# Patient Record
Sex: Female | Born: 1975 | Hispanic: No | Marital: Married | Smoking: Never smoker
Health system: Southern US, Community
[De-identification: ages and names within clinical notes are randomized; demographics above are authoritative.]

## PROBLEM LIST (undated history)

## (undated) DIAGNOSIS — E039 Hypothyroidism, unspecified: Secondary | ICD-10-CM

## (undated) DIAGNOSIS — R87629 Unspecified abnormal cytological findings in specimens from vagina: Secondary | ICD-10-CM

## (undated) DIAGNOSIS — F329 Major depressive disorder, single episode, unspecified: Secondary | ICD-10-CM

## (undated) DIAGNOSIS — F32A Depression, unspecified: Secondary | ICD-10-CM

## (undated) HISTORY — PX: WISDOM TOOTH EXTRACTION: SHX21

## (undated) HISTORY — DX: Hypothyroidism, unspecified: E03.9

## (undated) HISTORY — PX: COLPOSCOPY: SHX161

## (undated) HISTORY — DX: Depression, unspecified: F32.A

## (undated) HISTORY — DX: Major depressive disorder, single episode, unspecified: F32.9

## (undated) HISTORY — DX: Unspecified abnormal cytological findings in specimens from vagina: R87.629

---

## 2004-08-15 ENCOUNTER — Other Ambulatory Visit: Admission: RE | Admit: 2004-08-15 | Discharge: 2004-08-15 | Payer: Self-pay | Admitting: Family Medicine

## 2005-10-27 ENCOUNTER — Other Ambulatory Visit: Admission: RE | Admit: 2005-10-27 | Discharge: 2005-10-27 | Payer: Self-pay | Admitting: Family Medicine

## 2006-11-19 ENCOUNTER — Other Ambulatory Visit: Admission: RE | Admit: 2006-11-19 | Discharge: 2006-11-19 | Payer: Self-pay | Admitting: Family Medicine

## 2007-11-22 ENCOUNTER — Other Ambulatory Visit: Admission: RE | Admit: 2007-11-22 | Discharge: 2007-11-22 | Payer: Self-pay | Admitting: Family Medicine

## 2010-04-01 ENCOUNTER — Other Ambulatory Visit: Admission: RE | Admit: 2010-04-01 | Discharge: 2010-04-01 | Payer: Self-pay | Admitting: Family Medicine

## 2011-04-23 ENCOUNTER — Other Ambulatory Visit: Payer: Self-pay | Admitting: Family Medicine

## 2011-04-23 ENCOUNTER — Other Ambulatory Visit (HOSPITAL_COMMUNITY)
Admission: RE | Admit: 2011-04-23 | Discharge: 2011-04-23 | Disposition: A | Discharge status: Home / Self Care | Payer: BC Managed Care – PPO | Source: Ambulatory Visit | Attending: Family Medicine | Admitting: Family Medicine

## 2011-04-23 DIAGNOSIS — Z1159 Encounter for screening for other viral diseases: Secondary | ICD-10-CM | POA: Insufficient documentation

## 2011-04-23 DIAGNOSIS — Z124 Encounter for screening for malignant neoplasm of cervix: Secondary | ICD-10-CM | POA: Insufficient documentation

## 2012-04-26 ENCOUNTER — Other Ambulatory Visit: Payer: Self-pay | Admitting: Family Medicine

## 2012-04-26 ENCOUNTER — Other Ambulatory Visit (HOSPITAL_COMMUNITY)
Admission: RE | Admit: 2012-04-26 | Discharge: 2012-04-26 | Disposition: A | Discharge status: Home / Self Care | Payer: BC Managed Care – PPO | Source: Ambulatory Visit | Attending: Family Medicine | Admitting: Family Medicine

## 2012-04-26 DIAGNOSIS — Z124 Encounter for screening for malignant neoplasm of cervix: Secondary | ICD-10-CM | POA: Insufficient documentation

## 2013-04-28 ENCOUNTER — Other Ambulatory Visit: Payer: Self-pay | Admitting: Family Medicine

## 2013-04-28 ENCOUNTER — Other Ambulatory Visit (HOSPITAL_COMMUNITY)
Admission: RE | Admit: 2013-04-28 | Discharge: 2013-04-28 | Disposition: A | Discharge status: Home / Self Care | Payer: BC Managed Care – PPO | Source: Ambulatory Visit | Attending: Family Medicine | Admitting: Family Medicine

## 2013-04-28 DIAGNOSIS — Z124 Encounter for screening for malignant neoplasm of cervix: Secondary | ICD-10-CM | POA: Insufficient documentation

## 2013-09-07 ENCOUNTER — Other Ambulatory Visit: Payer: Self-pay | Admitting: Family Medicine

## 2013-09-07 DIAGNOSIS — N63 Unspecified lump in unspecified breast: Secondary | ICD-10-CM

## 2013-09-12 ENCOUNTER — Other Ambulatory Visit: Payer: BC Managed Care – PPO

## 2013-09-15 ENCOUNTER — Other Ambulatory Visit: Payer: Self-pay | Admitting: Family Medicine

## 2013-09-15 ENCOUNTER — Ambulatory Visit
Admission: RE | Admit: 2013-09-15 | Discharge: 2013-09-15 | Disposition: A | Discharge status: Home / Self Care | Payer: BC Managed Care – PPO | Source: Ambulatory Visit | Attending: Family Medicine | Admitting: Family Medicine

## 2013-09-15 ENCOUNTER — Encounter (INDEPENDENT_AMBULATORY_CARE_PROVIDER_SITE_OTHER): Payer: Self-pay

## 2013-09-15 DIAGNOSIS — N63 Unspecified lump in unspecified breast: Secondary | ICD-10-CM

## 2014-05-02 ENCOUNTER — Other Ambulatory Visit: Payer: Self-pay | Admitting: Family Medicine

## 2014-05-02 ENCOUNTER — Other Ambulatory Visit (HOSPITAL_COMMUNITY)
Admission: RE | Admit: 2014-05-02 | Discharge: 2014-05-02 | Disposition: A | Discharge status: Home / Self Care | Payer: BC Managed Care – PPO | Source: Ambulatory Visit | Attending: Family Medicine | Admitting: Family Medicine

## 2014-05-02 DIAGNOSIS — Z1151 Encounter for screening for human papillomavirus (HPV): Secondary | ICD-10-CM | POA: Insufficient documentation

## 2014-05-02 DIAGNOSIS — Z124 Encounter for screening for malignant neoplasm of cervix: Secondary | ICD-10-CM | POA: Insufficient documentation

## 2014-05-03 LAB — CYTOLOGY - PAP

## 2014-05-25 ENCOUNTER — Other Ambulatory Visit: Payer: Self-pay | Admitting: Family Medicine

## 2014-05-29 LAB — CYTOLOGY - PAP

## 2015-05-27 NOTE — L&D Delivery Note (Signed)
Delivery Note At 9:53 PM a viable female was delivered via Vaginal, Spontaneous Delivery (Presentation: ;  LOA).  APGAR: 8;9 weight 7 lb 2.1 oz (3235 g).   Placenta status: to pathology.  Cord:  with the following complications: none  Anesthesia:  epidural Episiotomy:  none Lacerations: 2nd degree;Perineal Suture Repair: 2.0 3.0 vicryl Est. Blood Loss (mL):  250cc  Mom to postpartum.  Baby to Couplet care / Skin to Skin.  [name], Margaret Miranda, Margaret Miranda 04/07/2016, 10:15 PM

## 2015-08-29 LAB — OB RESULTS CONSOLE HIV ANTIBODY (ROUTINE TESTING): HIV: NONREACTIVE

## 2015-08-29 LAB — OB RESULTS CONSOLE GC/CHLAMYDIA
CHLAMYDIA, DNA PROBE: NEGATIVE
GC PROBE AMP, GENITAL: NEGATIVE

## 2015-08-29 LAB — OB RESULTS CONSOLE ABO/RH: RH Type: POSITIVE

## 2015-08-29 LAB — OB RESULTS CONSOLE RUBELLA ANTIBODY, IGM: RUBELLA: IMMUNE

## 2015-08-29 LAB — OB RESULTS CONSOLE RPR: RPR: NONREACTIVE

## 2015-08-29 LAB — OB RESULTS CONSOLE ANTIBODY SCREEN: Antibody Screen: NEGATIVE

## 2015-08-29 LAB — OB RESULTS CONSOLE HEPATITIS B SURFACE ANTIGEN: Hepatitis B Surface Ag: NEGATIVE

## 2016-02-28 LAB — OB RESULTS CONSOLE GBS: GBS: NEGATIVE

## 2016-03-26 ENCOUNTER — Inpatient Hospital Stay (HOSPITAL_COMMUNITY): Admission: AD | Admit: 2016-03-26 | Payer: Self-pay | Source: Ambulatory Visit | Admitting: Obstetrics & Gynecology

## 2016-03-28 ENCOUNTER — Telehealth (HOSPITAL_COMMUNITY): Payer: Self-pay | Admitting: *Deleted

## 2016-03-28 ENCOUNTER — Encounter (HOSPITAL_COMMUNITY): Payer: Self-pay | Admitting: *Deleted

## 2016-03-28 NOTE — Telephone Encounter (Signed)
Preadmission screen  

## 2016-03-31 ENCOUNTER — Telehealth (HOSPITAL_COMMUNITY): Payer: Self-pay | Admitting: *Deleted

## 2016-03-31 ENCOUNTER — Encounter (HOSPITAL_COMMUNITY): Payer: Self-pay | Admitting: *Deleted

## 2016-03-31 NOTE — Telephone Encounter (Signed)
Preadmission screen  

## 2016-04-06 NOTE — H&P (Signed)
HPI: 40 y/o G1P0 @ 2222w4d estimated gestational age (as dated by LMP c/w 20 week ultrasound) presents for IOL for full term pregnancy.   no Leaking of Fluid,   no Vaginal Bleeding,   no Uterine Contractions,  + Fetal Movement.  ROS: no HA, no epigastric pain, no visual changes.    Pregnancy complicated by: 1) AMA- low risk female 2) Hypothyroidism: on Levothyroxine 88mcg daily, TSH remains within normal limits 3) h/o Depression: no medication currently previously on Venlafaxine prior to pregnancy   Prenatal Transfer Tool  Maternal Diabetes: No Genetic Screening: Normal Maternal Ultrasounds/Referrals: Normal Fetal Ultrasounds or other Referrals:  None Maternal Substance Abuse:  No Significant Maternal Medications:  Meds include: Syntroid Significant Maternal Lab Results: Lab values include: Group B Strep negative   PNL:  GBS negative, Rub Immune, Hep B neg, RPR NR, HIV neg, GC/C neg, glucola:normal Hgb: 10.9, low risk panorama, AFP neg Blood type: O positive, antibody neg  Immunizations: Tdap: 8/29 Flu: 9/28  OBHx: primip PMHx:  Hypothyroidism Meds:  PNV, Synthroid Allergy:   Allergies  Allergen Reactions  . Ambien [Zolpidem Tartrate] Other (See Comments)    hallucinations   SurgHx: none SocHx:   no Tobacco, no  EtOH, no Illicit Drugs  O: LMP 06/27/2015  Gen. AAOx3, NAD CV.  RRR  No murmur.  Resp. CTAB, no wheeze or crackles. Abd. Gravid,  No tenderness Extr.  1+ non-pitting edema, no calf tenderness bilaterally  FHT: Reassuring by doppler in office, 135bpm SVE: closed/long/high, soft, mid position   Labs: see orders  A/P:  40 y.o. G1P0 @ 2622w4d EGA who presents for IOL due to full term pregnancy -FWB: Reassuring by doppler -Labor: plan for cytotec per protocol -GBS: negative -Pain: IV pain or epidural upon request -Hypothyroidism: continue with synthroid 88mcg daily  Myna HidalgoJennifer Luann Aspinwall, DO [phone] (pager) [phone] (office)

## 2016-04-07 ENCOUNTER — Inpatient Hospital Stay (HOSPITAL_COMMUNITY): Payer: BC Managed Care – PPO | Admitting: Anesthesiology

## 2016-04-07 ENCOUNTER — Encounter (HOSPITAL_COMMUNITY): Payer: Self-pay

## 2016-04-07 ENCOUNTER — Inpatient Hospital Stay (HOSPITAL_COMMUNITY)
Admission: RE | Admit: 2016-04-07 | Discharge: 2016-04-09 | DRG: 775 | Disposition: A | Discharge status: Home / Self Care | Payer: BC Managed Care – PPO | Source: Ambulatory Visit | Attending: Obstetrics & Gynecology | Admitting: Obstetrics & Gynecology

## 2016-04-07 DIAGNOSIS — E039 Hypothyroidism, unspecified: Secondary | ICD-10-CM | POA: Diagnosis present

## 2016-04-07 DIAGNOSIS — O99284 Endocrine, nutritional and metabolic diseases complicating childbirth: Secondary | ICD-10-CM | POA: Diagnosis present

## 2016-04-07 DIAGNOSIS — Z3403 Encounter for supervision of normal first pregnancy, third trimester: Secondary | ICD-10-CM | POA: Diagnosis present

## 2016-04-07 DIAGNOSIS — Z3A40 40 weeks gestation of pregnancy: Secondary | ICD-10-CM

## 2016-04-07 DIAGNOSIS — Z3493 Encounter for supervision of normal pregnancy, unspecified, third trimester: Secondary | ICD-10-CM

## 2016-04-07 LAB — TYPE AND SCREEN
ABO/RH(D): O POS
ANTIBODY SCREEN: NEGATIVE

## 2016-04-07 LAB — CBC
HCT: 35.3 % — ABNORMAL LOW (ref 36.0–46.0)
Hemoglobin: 12.6 g/dL (ref 12.0–15.0)
MCH: 33.3 pg (ref 26.0–34.0)
MCHC: 35.7 g/dL (ref 30.0–36.0)
MCV: 93.4 fL (ref 78.0–100.0)
PLATELETS: 184 10*3/uL (ref 150–400)
RBC: 3.78 MIL/uL — AB (ref 3.87–5.11)
RDW: 13.3 % (ref 11.5–15.5)
WBC: 7.4 10*3/uL (ref 4.0–10.5)

## 2016-04-07 LAB — ABO/RH: ABO/RH(D): O POS

## 2016-04-07 MED ORDER — EPHEDRINE 5 MG/ML INJ
10.0000 mg | INTRAVENOUS | Status: DC | PRN
  Filled 2016-04-07: qty 4

## 2016-04-07 MED ORDER — PHENYLEPHRINE 40 MCG/ML (10ML) SYRINGE FOR IV PUSH (FOR BLOOD PRESSURE SUPPORT)
80.0000 ug | PREFILLED_SYRINGE | INTRAVENOUS | Status: DC | PRN
  Filled 2016-04-07: qty 5

## 2016-04-07 MED ORDER — DIPHENHYDRAMINE HCL 50 MG/ML IJ SOLN: 12.5000 mg | INTRAMUSCULAR | Status: DC | PRN

## 2016-04-07 MED ORDER — BUTORPHANOL TARTRATE 1 MG/ML IJ SOLN
INTRAMUSCULAR | Status: AC
  Filled 2016-04-07: qty 1

## 2016-04-07 MED ORDER — PHENYLEPHRINE 40 MCG/ML (10ML) SYRINGE FOR IV PUSH (FOR BLOOD PRESSURE SUPPORT)
PREFILLED_SYRINGE | INTRAVENOUS | Status: DC
Start: 2016-04-07 — End: 2016-04-07
  Filled 2016-04-07: qty 10

## 2016-04-07 MED ORDER — LIDOCAINE HCL (PF) 1 % IJ SOLN
INTRAMUSCULAR | Status: DC | PRN
  Administered 2016-04-07 (×2): 7 mL via EPIDURAL

## 2016-04-07 MED ORDER — TERBUTALINE SULFATE 1 MG/ML IJ SOLN
0.2500 mg | Freq: Once | INTRAMUSCULAR | Status: DC | PRN
  Filled 2016-04-07: qty 1

## 2016-04-07 MED ORDER — FENTANYL 2.5 MCG/ML BUPIVACAINE 1/10 % EPIDURAL INFUSION (WH - ANES)
INTRAMUSCULAR | Status: AC
  Filled 2016-04-07: qty 100

## 2016-04-07 MED ORDER — ONDANSETRON HCL 4 MG/2ML IJ SOLN
4.0000 mg | Freq: Four times a day (QID) | INTRAMUSCULAR | Status: DC | PRN
  Administered 2016-04-07: 4 mg via INTRAVENOUS
  Filled 2016-04-07: qty 2

## 2016-04-07 MED ORDER — SOD CITRATE-CITRIC ACID 500-334 MG/5ML PO SOLN
30.0000 mL | ORAL | Status: DC | PRN
  Administered 2016-04-07: 30 mL via ORAL
  Filled 2016-04-07: qty 15

## 2016-04-07 MED ORDER — FENTANYL 2.5 MCG/ML BUPIVACAINE 1/10 % EPIDURAL INFUSION (WH - ANES)
14.0000 mL/h | INTRAMUSCULAR | Status: DC | PRN
  Administered 2016-04-07 (×2): 14 mL/h via EPIDURAL
  Administered 2016-04-07: 7 mL/h via EPIDURAL
  Filled 2016-04-07: qty 100

## 2016-04-07 MED ORDER — OXYTOCIN 40 UNITS IN LACTATED RINGERS INFUSION - SIMPLE MED
2.5000 [IU]/h | INTRAVENOUS | Status: DC
  Filled 2016-04-07: qty 1000

## 2016-04-07 MED ORDER — LACTATED RINGERS IV SOLN: 500.0000 mL | Freq: Once | INTRAVENOUS | Status: DC

## 2016-04-07 MED ORDER — BUTORPHANOL TARTRATE 1 MG/ML IJ SOLN
1.0000 mg | INTRAMUSCULAR | Status: DC | PRN
  Administered 2016-04-07: 1 mg via INTRAVENOUS

## 2016-04-07 MED ORDER — OXYTOCIN 40 UNITS IN LACTATED RINGERS INFUSION - SIMPLE MED
1.0000 m[IU]/min | INTRAVENOUS | Status: DC
  Administered 2016-04-07: 2 m[IU]/min via INTRAVENOUS

## 2016-04-07 MED ORDER — OXYTOCIN BOLUS FROM INFUSION: 500.0000 mL | Freq: Once | INTRAVENOUS | Status: DC

## 2016-04-07 MED ORDER — LACTATED RINGERS IV SOLN
INTRAVENOUS | Status: DC
  Administered 2016-04-07 (×4): via INTRAVENOUS

## 2016-04-07 MED ORDER — OXYCODONE-ACETAMINOPHEN 5-325 MG PO TABS: 1.0000 | ORAL_TABLET | ORAL | Status: DC | PRN

## 2016-04-07 MED ORDER — MISOPROSTOL 25 MCG QUARTER TABLET
25.0000 ug | ORAL_TABLET | ORAL | Status: DC | PRN
  Administered 2016-04-07 (×3): 25 ug via VAGINAL
  Filled 2016-04-07 (×2): qty 0.25
  Filled 2016-04-07: qty 1
  Filled 2016-04-07: qty 0.25

## 2016-04-07 MED ORDER — OXYCODONE-ACETAMINOPHEN 5-325 MG PO TABS: 2.0000 | ORAL_TABLET | ORAL | Status: DC | PRN

## 2016-04-07 MED ORDER — PHENYLEPHRINE 40 MCG/ML (10ML) SYRINGE FOR IV PUSH (FOR BLOOD PRESSURE SUPPORT)
80.0000 ug | PREFILLED_SYRINGE | INTRAVENOUS | Status: DC | PRN
Start: 2016-04-07 — End: 2016-04-08
  Filled 2016-04-07: qty 5

## 2016-04-07 MED ORDER — LIDOCAINE HCL (PF) 1 % IJ SOLN
30.0000 mL | INTRAMUSCULAR | Status: DC | PRN
  Filled 2016-04-07: qty 30

## 2016-04-07 MED ORDER — LACTATED RINGERS IV SOLN: 500.0000 mL | INTRAVENOUS | Status: DC | PRN

## 2016-04-07 MED ORDER — ACETAMINOPHEN 325 MG PO TABS: 650.0000 mg | ORAL_TABLET | ORAL | Status: DC | PRN

## 2016-04-07 NOTE — Progress Notes (Signed)
OB PN:  S: Pt resting comfortably, feels occasional contractions- not very painful, no acute complaints  O: BP 105/67   Pulse 64   Temp 97.7 F (36.5 C)   Resp 18   Ht 5\' 4"  (1.626 m)   Wt 88 kg (194 lb)   LMP 06/27/2015   BMI 33.30 kg/m   FHT: 140bpm, moderate variablity, + accels, no decels Toco: irregular SVE: deferred  A/P: [age] y.o. G1P0 @ 6364w5d for IOL for full term pregnancy 1. FWB: Cat. I 2. Labor: continue with cytotec, next dose @ 9am Pain: IV or epidural upon request GBS: negative Diet: Ok for breakfast before next cytotec then back to thin CLD only  Myna HidalgoJennifer Jandy Brackens, DO [phone] (pager) [phone] (office)

## 2016-04-07 NOTE — Progress Notes (Signed)
OB PN:  S: Resting comfortably with epidural  O: BP 112/68   Pulse 76   Temp 97.7 F (36.5 C)   Resp 18   Ht 5\' 4"  (1.626 m)   Wt 194 lb (88 kg)   LMP 06/27/2015   SpO2 100%   BMI 33.30 kg/m   FHT: 135bpm, moderate variablity, + accels, no decels Toco: q2-473min SVE: 4/50/-3, IUPC placed  A/P: 40 y.o. G1P0 @ 4541w5d for IOL for full term pregnancy 1. FWB: Cat. I 2. Labor: continue Pit per protocol, IUPC placed for further titration of pitocin Pain: continue epidural GBS: negative  Myna HidalgoJennifer Ayame Rena, DO [phone] (pager) [phone] (office)

## 2016-04-07 NOTE — Anesthesia Preprocedure Evaluation (Addendum)
Anesthesia Evaluation  Patient identified by MRN, date of birth, ID band Patient awake    Reviewed: Allergy & Precautions, H&P , NPO status , Patient's Chart, lab work & pertinent test results  Airway Mallampati: II  TM Distance: >3 FB Neck ROM: full    Dental no notable dental hx.    Pulmonary neg pulmonary ROS,    Pulmonary exam normal        Cardiovascular negative cardio ROS Normal cardiovascular exam     Neuro/Psych negative neurological ROS     GI/Hepatic negative GI ROS, Neg liver ROS,   Endo/Other    Renal/GU negative Renal ROS     Musculoskeletal   Abdominal (+) + obese,   Peds  Hematology negative hematology ROS (+)   Anesthesia Other Findings   Reproductive/Obstetrics (+) Pregnancy                             Anesthesia Physical Anesthesia Plan  ASA: II  Anesthesia Plan: Epidural   Post-op Pain Management:    Induction:   Airway Management Planned:   Additional Equipment:   Intra-op Plan:   Post-operative Plan:   Informed Consent: I have reviewed the patients History and Physical, chart, labs and discussed the procedure including the risks, benefits and alternatives for the proposed anesthesia with the patient or authorized representative who has indicated his/her understanding and acceptance.     Plan Discussed with:   Anesthesia Plan Comments:         Anesthesia Quick Evaluation

## 2016-04-07 NOTE — Anesthesia Procedure Notes (Signed)
Epidural Patient location during procedure: OB Start time: 04/07/2016 2:30 PM End time: 04/07/2016 2:34 PM  Staffing Anesthesiologist: Leilani AbleHATCHETT, Shaila Gilchrest Performed: anesthesiologist   Preanesthetic Checklist Completed: patient identified, surgical consent, pre-op evaluation, timeout performed, IV checked, risks and benefits discussed and monitors and equipment checked  Epidural Patient position: sitting Prep: site prepped and draped and DuraPrep Patient monitoring: continuous pulse ox and blood pressure Approach: midline Location: L3-L4 Injection technique: LOR air  Needle:  Needle type: Tuohy  Needle gauge: 17 G Needle length: 9 cm and 9 Needle insertion depth: 6 cm Catheter type: closed end flexible Catheter size: 19 Gauge Catheter at skin depth: 11 cm Test dose: negative and Other  Assessment Sensory level: T9 Events: blood not aspirated, injection not painful, no injection resistance, negative IV test and no paresthesia  Additional Notes Reason for block:procedure for pain

## 2016-04-07 NOTE — Progress Notes (Signed)
OB PN:  S: Pt resting comfortably, feels occasional contractions- not very painful, no acute complaints  O: BP 105/67   Pulse 64   Temp 97.7 F (36.5 C)   Resp 18   Ht 5\' 4"  (1.626 m)   Wt 88 kg (194 lb)   LMP 06/27/2015   BMI 33.30 kg/m   FHT: 135bpm, moderate variablity, + accels, no decels Toco: q3-774min SVE: 1/50/-3, Foley placed with some difficulty due to discomfort  A/P: 40 y.o. G1P0 @ 4335w5d for IOL for full term pregnancy 1. FWB: Cat. I 2. Labor: Foley in place, plan for Pit per protocol Pain: IV Stadol or epidural upon request GBS: negative  Myna HidalgoJennifer Delvis Kau, DO [phone] (pager) [phone] (office)

## 2016-04-07 NOTE — Progress Notes (Signed)
OB PN:  S: Resting comfortably with epidural  O: BP 110/61   Pulse 72   Temp 97.9 F (36.6 C) (Oral)   Resp 18   Ht 5\' 4"  (1.626 m)   Wt 194 lb (88 kg)   LMP 06/27/2015   SpO2 100%   BMI 33.30 kg/m   FHT: 135bpm, moderate variablity, + accels, occasional variable decels Toco: q2-383min, adequate MVUs SVE: C/C/0  A/P: 40 y.o. G1P0 @ 4863w5d for IOL for full term pregnancy 1. FWB: Cat. II, overall FHT reassuring and significant cervical change noted in short time frame, will continue to closely monitor 2. Labor: continue Pit per protocol, IUPC in place.  Plan to start pushing Pain: continue epidural GBS: negative  Margaret HidalgoJennifer Keiana Tavella, DO [phone] (pager) [phone] (office)

## 2016-04-07 NOTE — Anesthesia Pain Management Evaluation Note (Signed)
  CRNA Pain Management Visit Note  Patient: Margaret Miranda, 40 y.o., female  "Hello I am a [facility] of the [facility]. We have an anesthesia team available at all times to provide care throughout the hospital, including epidural management and anesthesia for C-section. I don't know your plan for the delivery whether it a natural birth, water birth, IV sedation, nitrous supplementation, doula or epidural, but we want to meet your pain goals."   1.Was your pain managed to your expectations on prior hospitalizations?   No prior hospitalizations  2.What is your expectation for pain management during this hospitalization?     Epidural  3.How can we help you reach that goal? epidural  Record the patient's initial score and the patient's pain goal.   Pain: 2  Pain Goal: 4 [facility] you to be able to say your pain was always managed very well.  Margaret Miranda 04/07/2016

## 2016-04-08 LAB — CBC
HCT: 29.2 % — ABNORMAL LOW (ref 36.0–46.0)
HEMOGLOBIN: 10.4 g/dL — AB (ref 12.0–15.0)
MCH: 33.5 pg (ref 26.0–34.0)
MCHC: 35.6 g/dL (ref 30.0–36.0)
MCV: 94.2 fL (ref 78.0–100.0)
Platelets: 149 10*3/uL — ABNORMAL LOW (ref 150–400)
RBC: 3.1 MIL/uL — AB (ref 3.87–5.11)
RDW: 13.5 % (ref 11.5–15.5)
WBC: 8.7 10*3/uL (ref 4.0–10.5)

## 2016-04-08 LAB — RPR: RPR: NONREACTIVE

## 2016-04-08 MED ORDER — ACETAMINOPHEN 325 MG PO TABS
650.0000 mg | ORAL_TABLET | ORAL | Status: DC | PRN
  Administered 2016-04-08 (×4): 650 mg via ORAL
  Filled 2016-04-08 (×4): qty 2

## 2016-04-08 MED ORDER — BENZOCAINE-MENTHOL 20-0.5 % EX AERO
1.0000 "application " | INHALATION_SPRAY | CUTANEOUS | Status: DC | PRN
  Administered 2016-04-08: 1 via TOPICAL
  Filled 2016-04-08: qty 56

## 2016-04-08 MED ORDER — DIBUCAINE 1 % RE OINT: 1.0000 "application " | TOPICAL_OINTMENT | RECTAL | Status: DC | PRN

## 2016-04-08 MED ORDER — IBUPROFEN 600 MG PO TABS
600.0000 mg | ORAL_TABLET | Freq: Four times a day (QID) | ORAL | Status: DC
  Administered 2016-04-08 – 2016-04-09 (×7): 600 mg via ORAL
  Filled 2016-04-08 (×7): qty 1

## 2016-04-08 MED ORDER — ONDANSETRON HCL 4 MG PO TABS: 4.0000 mg | ORAL_TABLET | ORAL | Status: DC | PRN

## 2016-04-08 MED ORDER — LEVOTHYROXINE SODIUM 88 MCG PO TABS
88.0000 ug | ORAL_TABLET | Freq: Every day | ORAL | Status: DC
  Administered 2016-04-08 – 2016-04-09 (×2): 88 ug via ORAL
  Filled 2016-04-08 (×2): qty 1

## 2016-04-08 MED ORDER — SENNOSIDES-DOCUSATE SODIUM 8.6-50 MG PO TABS
2.0000 | ORAL_TABLET | ORAL | Status: DC
  Administered 2016-04-08: 2 via ORAL
  Filled 2016-04-08 (×2): qty 2

## 2016-04-08 MED ORDER — COCONUT OIL OIL
1.0000 "application " | TOPICAL_OIL | Status: DC | PRN
  Administered 2016-04-09: 1 via TOPICAL
  Filled 2016-04-08: qty 120

## 2016-04-08 MED ORDER — ONDANSETRON HCL 4 MG/2ML IJ SOLN: 4.0000 mg | INTRAMUSCULAR | Status: DC | PRN

## 2016-04-08 MED ORDER — WITCH HAZEL-GLYCERIN EX PADS: 1.0000 "application " | MEDICATED_PAD | CUTANEOUS | Status: DC | PRN

## 2016-04-08 MED ORDER — TETANUS-DIPHTH-ACELL PERTUSSIS 5-2.5-18.5 LF-MCG/0.5 IM SUSP: 0.5000 mL | Freq: Once | INTRAMUSCULAR | Status: DC

## 2016-04-08 MED ORDER — PRENATAL MULTIVITAMIN CH
1.0000 | ORAL_TABLET | Freq: Every day | ORAL | Status: DC
  Administered 2016-04-08 – 2016-04-09 (×2): 1 via ORAL
  Filled 2016-04-08 (×2): qty 1

## 2016-04-08 MED ORDER — FAMOTIDINE 20 MG PO TABS: 20.0000 mg | ORAL_TABLET | Freq: Two times a day (BID) | ORAL | Status: DC | PRN

## 2016-04-08 MED ORDER — SIMETHICONE 80 MG PO CHEW: 80.0000 mg | CHEWABLE_TABLET | ORAL | Status: DC | PRN

## 2016-04-08 MED ORDER — DIPHENHYDRAMINE HCL 25 MG PO CAPS: 25.0000 mg | ORAL_CAPSULE | Freq: Four times a day (QID) | ORAL | Status: DC | PRN

## 2016-04-08 NOTE — Progress Notes (Signed)
CSW acknowledges consult.  CSW attempted to meet with MOB, however MOB had several room guest.  CSW will attempt to visit with MOB at a later time.   Jodette Wik Boyd-Gilyard, MSW, LCSW Clinical Social Work [phone]  

## 2016-04-08 NOTE — Anesthesia Postprocedure Evaluation (Signed)
Anesthesia Post Note  Patient: Margaret Miranda  Procedure(s) Performed: * No procedures listed *  Patient location during evaluation: Mother Baby Anesthesia Type: Epidural Level of consciousness: awake and alert Pain management: pain level controlled Vital Signs Assessment: post-procedure vital signs reviewed and stable Respiratory status: spontaneous breathing, nonlabored ventilation and respiratory function stable Cardiovascular status: stable Postop Assessment: no headache, no backache and epidural receding Anesthetic complications: no     Last Vitals:  Vitals:   04/08/16 0117 04/08/16 0535  BP: 114/65 105/69  Pulse: 72 64  Resp: 18 18  Temp: 36.7 C 36.6 C    Last Pain:  Vitals:   04/08/16 0535  TempSrc: Axillary  PainSc:    Pain Goal:                 Junious SilkGILBERT,Lawerance Matsuo

## 2016-04-08 NOTE — Lactation Note (Signed)
This note was copied from a baby's chart. Lactation Consultation Note  Patient Name: Margaret Miranda [id number] Date: 04/08/2016 Reason for consult: Initial assessment   Initial assessment with first time mom of 15 hour old infant. Infant was STS and quietly alert with mom after his bath and mom reports she just stopped feeding. Maternal history of depression and hypothyroidism, on Synthroid. Room was full of visitors so met with mom briefly.  Infant with 3 BF for 20-35 minutes, 2 attempts, 1 void and 2 stools since birth. Infant was Moderate MSF at birth. Infant weight 7 lb 2.1 oz. LATCH Scores 7-8 by bedside RN's.  Mom reports infant is feeding well, she reports the nurses have assisted her through any difficulties they have had with BF. She reports she has been shown how to hand express.   BF Resources Handout and [employer] given, mom informed of IP/OP Services, BF Support Groups and [place] phone #. Enc mom to call out to desk for feeding assistance as needed. Mom declined needing assistance at this time. Follow up tomorrow and prn.    Maternal Data Formula Feeding for Exclusion: No Has patient been taught Hand Expression?: Yes Does the patient have breastfeeding experience prior to this delivery?: No  Feeding Feeding Type: Breast Fed Length of feed: 1 min (start post bath)  LATCH Score/Interventions                      Lactation Tools Discussed/Used WIC Program: No   Consult Status Consult Status: Follow-up Date: 04/09/16 Follow-up type: In-patient    Debby Freiberg Rylynn Schoneman 04/08/2016, 10:49 AM

## 2016-04-08 NOTE — Progress Notes (Signed)
Postpartum Note Day # 1  S:  Patient resting comfortable in bed.  Pain controlled.  Tolerating general diet. No flatus, no BM.  Lochia moderate.  Ambulating without difficulty.  She denies n/v/f/c, SOB, or CP.  Pt plans on breastfeeding.  O: Temp:  [97.8 F (36.6 C)-98.6 F (37 C)] 97.8 F (36.6 C) (11/14 0535) Pulse Rate:  [64-101] 64 (11/14 0535) Resp:  [16-18] 18 (11/14 0535) BP: (100-137)/(61-86) 105/69 (11/14 0535) SpO2:  [99 %-100 %] 99 % (11/14 0535)   Gen: A&Ox3, NAD CV: RRR, no MRG Resp: CTAB Abdomen: soft, NT, ND Uterus: firm, non-tender, below umbilicus Ext: No edema, no calf tenderness bilaterally, SCDs in place  Labs:  Recent Labs  04/07/16 0030 04/08/16 0507  HGB 12.6 10.4*    A/P: Pt is a 40 y.o. G1P1001 s/p NSVD, PPD#1  - Pain well controlled -GU: UOP is adequate -GI: Tolerating general diet -Activity: encouraged sitting up to chair and ambulation as tolerated -Prophylaxis: early ambulation -Hypothyroidism- continue <MEASUREMENOrchard Surgical Center [phone] Kitchen914ylAnibal H4Grace Tops SurgiAndTildaRiver eTerrell State [phone] Kitchen914ylAnibal H4[facility] Surgery CeTemple Va Medical Center (Va Central TeAndTildaNe UWayne County [phone] Kitchen914ylAnibal H4[facility] La[facility] Laser And AndTi [facility] [phone] Kitchen914ylAnibal H4[facility] [place] MedicaSaint Joseph'S Regional MedAnd lMarIu Health Saxony [phone] Kitchen914ylAnibal H4Jfk Medical Center NortSurgery CenAndTil MNew KnHebrew Rehabilitation Center At [phone] Kitchen914ylAnibal H4Loring PhysiciaAndT dKellSelect Specialty Hospital - [phone] Kitchen914ylAnibal H4[place] Community CidraAndTildaS kWalnEastern New Mexico Medical [phone] Kitchen914ylAnibal H4[facility] Mount AndTil ALucernSouthern Ohio Eye Surgery Center [phone] Kitchen914ylAnibal H4Moh[facility] Valley Heart InstitArizonAndTildaJu tCarnSt. Mary'S Hospital And [phone] Kitchen914ylAnibal H4[facility] Health RehabilitationAnd<MEASUREMENTInspira Health Center [phone] Kitchen914ylAnibal H4Greenbaum Surgical SpecAndTil PPWinchester [phone] Kitchen914ylAnibal H4Aurora MedicaBaptist Health MedicAndTil SBeatrice Community [phone] Kitchen914ylAnibal H4V[facility] Hospital [facility] MahoninLaguna Honda Hospital AndAndT dCommunity Memorial Healthcar209 [phone] Kitchen914ylAnibal H4[facility] Orthopaedic ICoral RidgeAndTil MWSierra Ambulatory Surgery Center A Medical [phone] Kitchen914ylAnibal H4Orthopedic Healthcare Ancillary Services LLC Dba Slocum Ambulatory SurgerMillmanderr AndTild oBigelow Firsthealth Moore Reg. Hosp. And [place] [phone] Kitchen914ylAnibal H4[facility] MedicaSchuAndTildaLake A aMayo Clinic Health System - Northland In [phone] Kitchen914ylAnibal H4[facility] [facility] West GeorgiAndTildaH gWillowHardin Memorial [phone] Kitchen914ylAnibal H4Select [facility] Hospital Barnes-JewisAnd lCoSt Vincent General Hospital [phone] Kitchen914ylAnibal H4[facility] Regional MedicaLebanon Veterans An iLake WiToms River Ambulatory Surgical [phone] Kitchen914ylAnibal H4[facility] Christus SantaAnd<MEASUREMENTCentura Health-St Thomas More [phone] Kitchen914ylAnibal H4Bowdle HeCentral Coast Cardiovascular Asc LLC Dba WestAndTild oStBoston Eye Surgery And Laser Center [phone] Kitchen914ylAnibal H4Metropolitan Methodist BakeAndTilda lNeSt. John [phone] Kitchen914ylAnibal H4Midmichigan Endoscopy CenSan Juan RAndTil BElSt Francis [phone] Kitchen914ylAnibal H4Rehabilitation Hospital Of The NUcsd-La Jolla, John M & SallAndTil KSoloEmma Pendleton Bradley [phone] Kitchen914ylAnibal H4Central [place] Surgery CeSanforAndTilda rWhiLincoln [phone] Kitchen914ylAnibal H4Upmc Horizon-Shenango VElAndTildaNe oQuillen Rehabilitation [phone] Kitchen914ylAnibal H4Va Medical Center And Ambulatory CarSurgery CeAndTildaSt. Helenadsic Companye as above -Plan for baby boy circ either later this afternoon or tomorrow morning  DISPO: Continue routine postpartum care, Dr. Varnado covering today  Latajah Thuman, DO [phone] (pager) [phone] (office)

## 2016-04-09 MED ORDER — IBUPROFEN 600 MG PO TABS: 600.0000 mg | ORAL_TABLET | Freq: Four times a day (QID) | ORAL | 0 refills | Status: DC

## 2016-04-09 NOTE — Clinical Social Work Maternal (Signed)
  CLINICAL SOCIAL WORK MATERNAL/CHILD NOTE  Patient Details  Name: Margaret Miranda MRN: [id number] Date of Birth: August 01, 1975  Date:  04/09/2016  Clinical Social Worker Initiating Note:  Laurey Arrow Date/ Time Initiated:  04/09/16/0912     Child's Name:  August Amoroso   Legal Guardian:  Mother   Need for Interpreter:  None   Date of Referral:  04/09/16     Reason for Referral:  Behavioral Health Issues, including SI  (hx of depression)   Referral Source:  [facility]   Address:  [address] [place], [place] [address]  Phone number:  [phone]   Household Members:  Self, Spouse   Natural Supports (not living in the home):  Spouse/significant other, Immediate Family, Extended Family, Parent   Professional Supports: None   Employment: Full-time   Type of Work: Metallurgist   Education:  Engineer, maintenance Resources:  Multimedia programmer   Other Resources:      Cultural/Religious Considerations Which May Impact Care:  None Reported  Strengths:  Ability to meet basic needs , Engineer, materials , Understanding of illness, Home prepared for child    Risk Factors/Current Problems:  Mental Health Concerns    Cognitive State:  Alert , Able to Concentrate , Insightful , Linear Thinking    Mood/Affect:  Bright , Interested , Happy    CSW Assessment: CSW met with MOB to complete an assessment for hx of depression. When CSW arrived, MOB was bonding with infant as evident by engaging in skin to skin.  MOB gave CSW permission to meet with MOB while FOB/Husband (Jody Camera) was present.  CSW inquired about MOB's MH hx and MOB acknowledged a hx of depression.  MOB reported that MOB was on a medication regiment and decided to discontinue after pregnancy confirmation. CSW inquired about MOB supports and MOB   Reported a wealth of support from FOB's family as well as MOB's immediate and extended family.  CSW offer MOB outpatient [place] resources and MOB  declined.  However, MOB was interested in the support groups offered at the hospital; CSW provided MOB with a flyer. CSW educated MOB about PPD. CSW informed MOB of possible supports and interventions to decrease PPD.  CSW also encouraged MOB to seek medical attention if needed for increased signs, symptoms for PPD. MOB did not have any further questions, concerns, or needs at this time.  CSW Plan/Description:  Information/Referral to [employer] , [employer] , No Further Intervention Required/No Barriers to Discharge   Laurey Arrow, MSW, LCSW Clinical Social Work [phone]    Dimple Nanas, LCSW 04/09/2016, 9:16 AM

## 2016-04-09 NOTE — Progress Notes (Signed)
Postpartum Note Day # 2  S:  Patient resting comfortable in bed.  Pain controlled.  Tolerating general diet. + flatus, no BM.  Lochia moderate.  Ambulating without difficulty.  She denies n/v/f/c, SOB, or CP.  Pt plans on breastfeeding.  O: Temp:  [98 F (36.7 C)-98.2 F (36.8 C)] 98.2 F (36.8 C) (11/15 0652) Pulse Rate:  [63-72] 63 (11/15 0652) Resp:  [16-18] 17 (11/15 0652) BP: (110-119)/(67-78) 119/78 (11/15 16100652)   Gen: A&Ox3, NAD CV: RRR, no MRG Resp: CTAB Abdomen: soft, NT, ND Uterus: firm, non-tender, below umbilicus Ext: No edema, no calf tenderness bilaterally, SCDs in place  Labs:   Recent Labs  04/07/16 0030 04/08/16 0507  HGB 12.6 10.4*    A/P: Pt is a 40 y.o. G1P1001 s/p NSVD, PPD#2  - Pain well controlled -GU: Voiding freely -GI: Tolerating general diet -Activity: encouraged sitting up to chair and ambulation as tolerated -Prophylaxis: early ambulation -Hypothyroidism- continue 88mcg daily -Labs: stable as above -Baby boy circ completed  DISPO: Meeting postpartum milestones appropriately, plan for discharge home today  Myna HidalgoJennifer Jyair Kiraly, DO [phone]) [phone] (office)

## 2016-04-09 NOTE — Discharge Instructions (Signed)

## 2016-04-09 NOTE — Lactation Note (Signed)
This note was copied from a baby's chart. Lactation Consultation Note  Patient Name: Margaret Lynnell GrainKatie Buttery [id number]'s Date: 04/09/2016 Reason for consult: Follow-up assessment  Mom reports that her nipples appear "smushed" when "August" releases latch. Mom noted to have red nipples w/a small area of redness around nipples that suggests a shallow latch.  Specifics of an asymmetric latch shown via [employer]KellyMom website animation. Mom assisted w/getting an asymmetric latch using the "teacup hold." Mom reported increased comfort w/these latches (& nipples appeared rounded or less bunched when infant released latch).  Mom reports no breast changes w/pregnancy; however, Mom does feel that breasts are heavier today. Breasts feel soft to this lactation consultant. Mom has small-diameter nipples; Mom already has size 21 flanges for her Medela DEBP. Parents were taught signs/sound of swallowing. Parents are aware that, ideally, milk should come to volume on the 3rd day, but a delay in that would likely mean the need to supplement with formula.   [facility] either [facility] Fri of this week or Monday of next week.    Note: Mom is hypothyroid & on Synthroid. She quit taking Effexor when she discovered she was pregnant. I made Mom aware that Effexor (L2) is compatible w/breastfeeding, in case needed during the postpartum period. Infant's appearance when tongue is elevated suggests the possibility of posterior tongue restriction.   For nipple soreness, Mom has been provided coconut oil by RN; Mom declines shells at this time. Parents have our # to call in case of post-discharge questions.   Lurline HareRichey, Davionna Blacksher [facility] 04/09/2016, 10:23 AM

## 2016-04-09 NOTE — Plan of Care (Signed)
Problem: Education: Goal: Knowledge of condition will improve Outcome: Completed/Met Date Met: 04/09/16 Discharge education and paperwork discussed.  Reasons to call MD reviewed.  Pt denies questions.

## 2016-04-09 NOTE — Discharge Summary (Signed)
OB Discharge Summary     Patient Name: Margaret GrainKatie Miranda DOB: [birth year] MRN: [id number]  Date of admission: 04/07/2016 Delivering MD: [name], Jenevieve Kirschbaum   Date of discharge: 04/09/2016  Admitting diagnosis: 40.5wks Intrauterine pregnancy: 252w5d     Secondary diagnosis:  Active Problems:   Normal intrauterine pregnancy in third trimester  Additional problems: Hypothyroidism, AMA     Discharge diagnosis: Term Pregnancy Delivered                                                                                                Post partum procedures:N/A  Augmentation: Pitocin, Cytotec and Foley Balloon  Complications: None  Hospital course:  Induction of Labor With Vaginal Delivery   40 y.o. yo G1P1001 at 192w5d was admitted to the hospital 04/07/2016 for induction of labor.  Indication for induction: Postdates.  Patient had an uncomplicated labor course as follows: Membrane Rupture Time/Date: 3:45 PM ,04/07/2016   Intrapartum Procedures: Episiotomy:                                           Lacerations:  2nd degree [3];Perineal [11]  Patient had delivery of a Viable infant.  Information for the patient's newborn:  Leodis SiasBardou, Boy Alizza [[id number]][[id number]]  Delivery Method: Vaginal, Spontaneous Delivery (Filed from Delivery Summary)   04/07/2016  Details of delivery can be found in separate delivery note.  Patient had a routine postpartum course. Patient is discharged home 04/09/16.   Physical exam Vitals:   04/08/16 0535 04/08/16 1330 04/08/16 1736 04/09/16 0652  BP: 105/69 110/67 110/71 119/78  Pulse: 64 72 72 63  Resp: 18 16 18 17   Temp: 97.8 F (36.6 C) 98 F (36.7 C) 98.1 F (36.7 C) 98.2 F (36.8 C)  TempSrc: Axillary Oral Oral Oral  SpO2: 99%     Weight:      Height:       General: alert, cooperative and no distress Lochia: appropriate Uterine Fundus: firm Incision: N/A DVT Evaluation: No evidence of DVT seen on physical exam. Labs: Lab Results  Component Value Date   WBC  8.7 04/08/2016   HGB 10.4 (L) 04/08/2016   HCT 29.2 (L) 04/08/2016   MCV 94.2 04/08/2016   PLT 149 (L) 04/08/2016   No flowsheet data found.  Discharge instruction: per After Visit Summary and "Baby and Me Booklet".  After visit meds:    Medication List    TAKE these medications   ibuprofen 600 MG tablet Commonly known as:  ADVIL,MOTRIN Take 1 tablet (600 mg total) by mouth every 6 (six) hours.   prenatal multivitamin Tabs tablet Take 1 tablet by mouth daily.   ranitidine 150 MG tablet Commonly known as:  ZANTAC Take 150 mg by mouth 2 (two) times daily as needed for heartburn.   SYNTHROID 88 MCG tablet Generic drug:  levothyroxine Take 88 mcg by mouth daily before breakfast.       Diet: routine diet  Activity: Advance as tolerated. Pelvic rest for 6 weeks.  Outpatient follow up:6 weeks Follow up Appt:No future appointments. Follow up Visit:No Follow-up on file.  Postpartum contraception: Not Discussed  Newborn Data: Live born female  Birth Weight: 7 lb 2.1 oz (3235 g) APGAR: 8, 9  Baby Feeding: Breast Disposition:home with mother   04/09/2016 [name], Laiken Nohr, M, DO

## 2016-04-11 ENCOUNTER — Telehealth (HOSPITAL_COMMUNITY): Payer: Self-pay | Admitting: Lactation Services

## 2017-04-14 ENCOUNTER — Ambulatory Visit: Payer: BC Managed Care – PPO | Attending: Obstetrics & Gynecology | Admitting: Physical Therapy

## 2017-04-14 ENCOUNTER — Encounter: Payer: Self-pay | Admitting: Physical Therapy

## 2017-04-14 DIAGNOSIS — M6281 Muscle weakness (generalized): Secondary | ICD-10-CM | POA: Diagnosis present

## 2017-04-14 DIAGNOSIS — R252 Cramp and spasm: Secondary | ICD-10-CM | POA: Diagnosis not present

## 2017-04-14 NOTE — Therapy (Addendum)
[facility] [address] [place], [place], [address] Phone: [phone]   Fax:  [phone]  Physical Therapy Evaluation  Patient Details  Name: Margaret Miranda MRN: [id number] Date of Birth: [birth year] Referring Provider: Dr. Rosario Jacks   Encounter Date: 04/14/2017  PT End of Session - 04/14/17 0854    Visit Number  1    Date for PT Re-Evaluation  08/12/17    Authorization Type  BCBS    PT Start Time  0800    PT Stop Time  0850    PT Time Calculation (min)  50 min    Activity Tolerance  Patient tolerated treatment well    Behavior During Therapy  [facility] for tasks assessed/performed       Past Medical History:  Diagnosis Date  . Depression   . Hypothyroidism   . Vaginal Pap smear, abnormal     Past Surgical History:  Procedure Laterality Date  . COLPOSCOPY    . WISDOM TOOTH EXTRACTION      There were no vitals filed for this visit.   Subjective Assessment - 04/14/17 0803    Subjective  Patient reports she had OB check up and have pain with intercourse since last child, 04/07/2016 .Patient was have bleeding vaginally for the past 5 weeks.  Pain with intercourse is on the left side and deep penetration. Patient reports her uterus was tilted more than it was.  Patient will tense up during intercourse due to fear of pain. Patient has to place tampon in differently since birth.     Patient Stated Goals  how to decrease pain    Currently in Pain?  Yes    Pain Score  5     Pain Location  Vagina    Pain Orientation  Left    Pain Descriptors / Indicators  Dull    Pain Type  Chronic pain    Pain Onset  More than a month ago    Pain Frequency  Intermittent    Aggravating Factors   during intercourse, pain with tampon    Pain Relieving Factors  no intercourse    Multiple Pain Sites  No         [facility] PT Assessment - 04/14/17 0001      Assessment   Medical Diagnosis  N94.10 Unspecified dyspareunia     Referring Provider  Dr. Rosario Jacks    Onset Date/Surgical Date  04/07/16    Prior Therapy  None      Precautions   Precautions  None      Restrictions   Weight Bearing Restrictions  No      Balance Screen   Has the patient fallen in the past 6 months  No    Has the patient had a decrease in activity level because of a fear of falling?   No    Is the patient reluctant to leave their home because of a fear of falling?   No      Home Film/video editor residence      Prior Function   Level of Independence  Independent    Vocation  Full time employment    Vocation Requirements  school teacher    Leisure  training for a 1/2 marathon      Cognition   Overall Cognitive Status  Within Functional Limits for tasks assessed      ROM / Strength   AROM / PROM / Strength  AROM;PROM;Strength      AROM   Lumbar Extension  decreased by 50%    Lumbar - Right Side Bend  decreased by 50%    Lumbar - Left Side Bend  decreased by 50%      Strength   Overall Strength Comments  abdominal strength is 3/5 with no lower abdominal contraction and pouches her stomach out    Right Hip Flexion  4/5    Right Hip ABduction  3+/5    Right Hip ADduction  3+/5    Left Hip Extension  4/5    Left Hip ABduction  3+/5      Palpation   Spinal mobility  decreased movement of L5 and S1    SI assessment   left ilium is rotated posterior    Palpation comment  tenderness located in right diaphgram and lower abdominal ; tenderness located on right calf      Transfers   Transfers  Not assessed      Ambulation/Gait   Ambulation/Gait  No             Objective measurements completed on examination: See above findings.    Pelvic Floor Special Questions - 04/14/17 0001    Prior Pregnancies  Yes    Number of Pregnancies  1    Number of Vaginal Deliveries  1    Episiotomy Performed  Yes    Diastasis Recti  no    Currently Sexually Active  Yes    Marinoff Scale  discomfort  that does not affect completion    Urinary Leakage  No    Urinary urgency  No    Skin Integrity  Intact    External Palpation  tenderness located on left bulbocavernosus, transverse perineum    Pelvic Floor Internal Exam  Patient confirms identification and approves therapist to assess muscle integrity    Exam Type  Vaginal    Palpation  tenderness located in bil. levator ani, left obutrator internist    Strength  weak squeeze, no lift no cirucular contraction or lift               PT Education - 04/14/17 0853    Education provided  Yes    Education Details  gave patient lubricant samples; education on lubricants to use; education on vaginal massagers to use for internal massage; calf stretch, prone pressup    Person(s) Educated  Patient    Methods  Explanation;Demonstration;Verbal cues;Handout    Comprehension  Returned demonstration;Verbalized understanding       PT Short Term Goals - 04/14/17 0903      PT SHORT TERM GOAL #1   Title  independent with vaginal massage to reduce trigger points and reduce pain with penial penetration    Time  4    Period  Weeks    Status  New    Target Date  05/12/17      PT SHORT TERM GOAL #2   Title  independent with initial HEP with core strength and massage    Time  4    Period  Weeks    Status  New    Target Date  05/12/17      PT SHORT TERM GOAL #3   Title  ability to contract abdominals without pouching out to improve pelvic stability and strength    Time  4    Status  New    Target Date  05/12/17      PT SHORT TERM GOAL #4  Title  pain with penile penetration decrease >/= 25%    Time  4    Period  Weeks    Status  New    Target Date  05/12/17        PT Long Term Goals - 04/14/17 0826      PT LONG TERM GOAL #1   Title  independent with HEP     Time  8    Period  Weeks    Status  New    Target Date  08/12/17      PT LONG TERM GOAL #2   Title  Minimal to no pain with intercourse due to ability to relax the  muscles    Time  8    Period  Months    Status  New    Target Date  08/12/17      PT LONG TERM GOAL #3   Title  Pelvis in correct alignment due to increased pelvic strength to 4/5 and bilateral hip strength is 4/5    Time  4    Period  Months    Status  New    Target Date  08/12/17      PT LONG TERM GOAL #4   Title  right calf pain decreased >/= 75% due to pelvis in correct alignment and full lumbar ROM so decreased strain on calf when the right foot hit the ground    Time  4    Period  Months    Status  New    Target Date  08/12/17             Plan - 04/14/17 0855    Clinical Impression Statement  Patient is a 41 year old female with pelvic floor pain with intercourse since her last child on 04/07/2016.  Patient reports her urterus is tilted more since her birth.  Patient has to place tampons in at a different angle since vaginal birth.  Patient did tear and have stitche with birth. Palpable tendenderness located in bilateral levator ani, left obturator internist, left bulbocaverosus, and transverse perineum. Left ilium is rotated posteriorly, Sacrum is rotated left, and decreased mobilty of L5 and S1.  Lumbar extension and bilateral sidebending decreased by 50%. Bilateral hip abduction strength is 3+/5, right hip adductor is 3+/5, left hip extension is 4/5 and right hip flexion is 4/5.  Patient is training for a half marathon and is having a cramp in right thigh which could be due to pelvic imbalance and decreased in hip strength. Pelvic floor strength is 2/5 with no circular contration or lift.  Patient will benefit from skilled therapy to improve tissue mobility, improve muscle balances, improve pelvic rotation and reduce pain with penial penetration.     History and Personal Factors relevant to plan of care:  Hypothyroidism    Clinical Presentation  Stable    Clinical Presentation due to:  stable condition    Clinical Decision Making  Low    Rehab Potential  Excellent     Clinical Impairments Affecting Rehab Potential  hypothyroidism    PT Frequency  1x / week may come 1 time per month    PT Duration  Other (comment) 24month    PT Treatment/Interventions  Biofeedback;Therapeutic activities;Therapeutic exercise;Patient/family education;Neuromuscular re-education;Manual techniques;Passive range of motion;Dry needling    PT Next Visit Plan  correct pelvis, lumbar mobilization, review past HEP, abdominal contraction to decrease pouch, pelvic bulge,     PT Home Exercise Plan  see above  Consulted and Agree with Plan of Care  Patient       Patient will benefit from skilled therapeutic intervention in order to improve the following deficits and impairments:  Increased fascial restricitons, Pain, Decreased mobility, Increased muscle spasms, Decreased strength, Decreased range of motion, Decreased activity tolerance  Visit Diagnosis: Cramp and spasm - Plan: PT plan of care cert/re-cert  Muscle weakness (generalized) - Plan: PT plan of care cert/re-cert     Problem List Patient Active Problem List   Diagnosis Date Noted  . Normal intrauterine pregnancy in third trimester 04/07/2016    Earlie Counts, PT 04/14/17 9:09 AM   [facility] [address] [place], [place], [address] Phone: [phone]   Fax:  [phone]  Name: Margaret Miranda MRN: [id number] Date of Birth: 11/15/1975 PHYSICAL THERAPY DISCHARGE SUMMARY  Visits from Start of Care: 1  Current functional level related to goals / functional outcomes: See above. Unable to assess patient due to not returning to therapy.    Remaining deficits: See above.    Education / Equipment: HEP Plan: Patient agrees to discharge.  Patient goals were not met. Patient is being discharged due to not returning since the last visit.  Thank you for the referral. Earlie Counts, PT 06/22/17 7:52 AM  ?????

## 2017-04-14 NOTE — Patient Instructions (Addendum)
Press-Up    Press upper body upward, keeping hips in contact with floor. Keep lower back and buttocks relaxed. Hold __1__ seconds. Repeat ___10_ times per set. Do __1__ sets per session. Do __2__ sessions per day.  http://orth.exer.us/94   Copyright  VHI. All rights reserved.     Lubrication . Used for intercourse to reduce friction . Avoid ones that have glycerin, warming gels, tingling gels, icing or cooling gel, scented . Avoid parabens due to a preservative similar to female sex hormone . May need to be reapplied once or several times during sexual activity . Can be applied to both partners genitals prior to vaginal penetration to minimize friction or irritation . Prevent irritation and mucosal tears that cause post coital pain and increased the risk of vaginal and urinary tract infections . Oil-based lubricants cannot be used with condoms due to breaking them down.  Least likely to irritate vaginal tissue.  . Plant based-lubes are safe . Silicone-based lubrication are thicker and last long and used for post-menopausal women  Vaginal Lubricators Here is a list of some suggested lubricators you can use for intercourse. Use the most hypoallergenic product.  You can place on you or your partner.   Slippery Stuff  Sylk or Sliquid Natural H2O ( good  if frequent UTI's)  Blossom Organics (www.blossom-organics.com)  Luvena   Coconut oil  PJur Woman Nude- water based lubricant, amazon  Uberlube- Amazon  Aloe Vera  Yes lubricant- Well[employer]mazon  Wet Platinum-Silicone, Target, [facility]  Olive and Bee intimate cream-  www.oliveandbee.com.au Things to avoid in lubricants are glycerin, warming gels, tingling gels, icing or cooling  gels, and scented gels.  Also avoid Vaseline. KY jelly, Replens, and Astroglide kills good bacteria(lactobacilli)    Things to avoid in the vaginal area . Do not use things to irritate the vulvar area . No lotions- see below . No soaps; can use  Aveeno or Calendula cleanser if needed. Must be gentle . No deodorants . No douches . Good to sleep without underwear to let the vaginal area to air out . No scrubbing: spread the lips to let warm water rinse over labias and pat dry  Creams that can be used on the Vulva Area  V CIT Groupmagic-amazon  Vital V Wild Yam Salve  BergerJulva- [employer]  MoonMaid Botanical Pro-Meno Wild Yam Cream  Desert Havest Releveum or Desert Xcel EnergyHarvest Gele    Gastroc Stretch    Stand with right foot back, leg straight, forward leg bent. Keeping heel on floor, turned slightly out, lean into wall until stretch is felt in calf. Hold __30__ seconds. Repeat __2__ times per set. Do __1__ sets per session. Do __2__ sessions per day.  http://orth.exer.us/26   Copyright  VHI. All rights reserved.  Soleus Stretch    Stand with right foot back, both knees bent. Keeping heel on floor, turned slightly out, lean into wall until stretch is felt in lower calf. Hold _30___ seconds. Repeat __2__ times per set. Do _1___ sets per session. Do __2__ sessions per day.  http://orth.exer.us/24     SVAKOM Vibrators Adult Toys for Women - Sex toys Optician, dispensingechargeable Electric Massager   Copyright  VHI. All rights reserved.

## 2017-05-04 ENCOUNTER — Ambulatory Visit: Payer: BC Managed Care – PPO | Admitting: Physical Therapy

## 2017-08-18 ENCOUNTER — Other Ambulatory Visit: Payer: Self-pay | Admitting: Family Medicine

## 2017-08-18 DIAGNOSIS — R202 Paresthesia of skin: Secondary | ICD-10-CM

## 2017-08-24 ENCOUNTER — Other Ambulatory Visit: Payer: BC Managed Care – PPO

## 2018-03-30 ENCOUNTER — Other Ambulatory Visit: Payer: Self-pay | Admitting: Obstetrics and Gynecology

## 2018-03-30 DIAGNOSIS — N6310 Unspecified lump in the right breast, unspecified quadrant: Secondary | ICD-10-CM

## 2018-04-06 ENCOUNTER — Other Ambulatory Visit: Payer: BC Managed Care – PPO

## 2018-04-07 ENCOUNTER — Other Ambulatory Visit: Payer: BC Managed Care – PPO

## 2018-04-09 ENCOUNTER — Other Ambulatory Visit: Payer: Self-pay | Admitting: Obstetrics and Gynecology

## 2018-04-09 ENCOUNTER — Ambulatory Visit
Admission: RE | Admit: 2018-04-09 | Discharge: 2018-04-09 | Disposition: A | Discharge status: Home / Self Care | Payer: BC Managed Care – PPO | Source: Ambulatory Visit | Attending: Obstetrics and Gynecology | Admitting: Obstetrics and Gynecology

## 2018-04-09 ENCOUNTER — Ambulatory Visit
Admission: RE | Admit: 2018-04-09 | Discharge: 2018-04-09 | Disposition: A | Discharge status: Home / Self Care | Payer: Commercial Managed Care - PPO | Source: Ambulatory Visit | Attending: Obstetrics and Gynecology | Admitting: Obstetrics and Gynecology

## 2018-04-09 DIAGNOSIS — N6310 Unspecified lump in the right breast, unspecified quadrant: Secondary | ICD-10-CM

## 2018-04-09 DIAGNOSIS — R599 Enlarged lymph nodes, unspecified: Secondary | ICD-10-CM

## 2018-04-15 ENCOUNTER — Ambulatory Visit
Admission: RE | Admit: 2018-04-15 | Discharge: 2018-04-15 | Disposition: A | Discharge status: Home / Self Care | Payer: Commercial Managed Care - PPO | Source: Ambulatory Visit | Attending: Obstetrics and Gynecology | Admitting: Obstetrics and Gynecology

## 2018-04-15 ENCOUNTER — Other Ambulatory Visit: Payer: Self-pay | Admitting: Obstetrics and Gynecology

## 2018-04-15 DIAGNOSIS — N6310 Unspecified lump in the right breast, unspecified quadrant: Secondary | ICD-10-CM

## 2018-04-15 DIAGNOSIS — R599 Enlarged lymph nodes, unspecified: Secondary | ICD-10-CM

## 2019-03-21 ENCOUNTER — Other Ambulatory Visit: Payer: Self-pay | Admitting: Obstetrics & Gynecology

## 2019-03-21 DIAGNOSIS — Z1231 Encounter for screening mammogram for malignant neoplasm of breast: Secondary | ICD-10-CM

## 2019-04-12 ENCOUNTER — Ambulatory Visit
Admission: RE | Admit: 2019-04-12 | Discharge: 2019-04-12 | Disposition: A | Discharge status: Home / Self Care | Payer: Commercial Managed Care - PPO | Source: Ambulatory Visit | Attending: Obstetrics & Gynecology | Admitting: Obstetrics & Gynecology

## 2019-04-12 ENCOUNTER — Other Ambulatory Visit: Payer: Self-pay

## 2019-04-12 DIAGNOSIS — Z1231 Encounter for screening mammogram for malignant neoplasm of breast: Secondary | ICD-10-CM

## 2019-04-13 ENCOUNTER — Other Ambulatory Visit: Payer: Self-pay | Admitting: Obstetrics & Gynecology

## 2019-04-13 DIAGNOSIS — R928 Other abnormal and inconclusive findings on diagnostic imaging of breast: Secondary | ICD-10-CM

## 2019-04-14 ENCOUNTER — Other Ambulatory Visit: Payer: Self-pay

## 2019-04-14 ENCOUNTER — Ambulatory Visit
Admission: RE | Admit: 2019-04-14 | Discharge: 2019-04-14 | Disposition: A | Discharge status: Home / Self Care | Payer: Commercial Managed Care - PPO | Source: Ambulatory Visit | Attending: Obstetrics & Gynecology | Admitting: Obstetrics & Gynecology

## 2019-04-14 ENCOUNTER — Ambulatory Visit: Payer: Commercial Managed Care - PPO

## 2019-04-14 DIAGNOSIS — R928 Other abnormal and inconclusive findings on diagnostic imaging of breast: Secondary | ICD-10-CM

## 2019-05-05 ENCOUNTER — Ambulatory Visit: Payer: Commercial Managed Care - PPO

## 2020-03-05 ENCOUNTER — Other Ambulatory Visit: Payer: Self-pay | Admitting: Obstetrics & Gynecology

## 2020-03-05 ENCOUNTER — Other Ambulatory Visit: Payer: Self-pay | Admitting: Obstetrics and Gynecology

## 2020-03-05 DIAGNOSIS — Z1231 Encounter for screening mammogram for malignant neoplasm of breast: Secondary | ICD-10-CM

## 2020-04-12 ENCOUNTER — Other Ambulatory Visit: Payer: Self-pay

## 2020-04-12 ENCOUNTER — Ambulatory Visit
Admission: RE | Admit: 2020-04-12 | Discharge: 2020-04-12 | Disposition: A | Discharge status: Home / Self Care | Payer: Commercial Managed Care - PPO | Source: Ambulatory Visit | Attending: Obstetrics and Gynecology | Admitting: Obstetrics and Gynecology

## 2020-04-12 DIAGNOSIS — Z1231 Encounter for screening mammogram for malignant neoplasm of breast: Secondary | ICD-10-CM

## 2020-05-10 ENCOUNTER — Ambulatory Visit (INDEPENDENT_AMBULATORY_CARE_PROVIDER_SITE_OTHER): Payer: Managed Care, Other (non HMO) | Admitting: Nurse Practitioner

## 2020-05-10 ENCOUNTER — Telehealth: Payer: Self-pay

## 2020-05-10 ENCOUNTER — Other Ambulatory Visit: Payer: Self-pay

## 2020-05-10 ENCOUNTER — Encounter: Payer: Self-pay | Admitting: Nurse Practitioner

## 2020-05-10 ENCOUNTER — Other Ambulatory Visit: Payer: Self-pay | Admitting: Nurse Practitioner

## 2020-05-10 VITALS — BP 118/80 | Wt 173.0 lb

## 2020-05-10 DIAGNOSIS — G43829 Menstrual migraine, not intractable, without status migrainosus: Secondary | ICD-10-CM

## 2020-05-10 DIAGNOSIS — Z01419 Encounter for gynecological examination (general) (routine) without abnormal findings: Secondary | ICD-10-CM | POA: Diagnosis not present

## 2020-05-10 DIAGNOSIS — Z30431 Encounter for routine checking of intrauterine contraceptive device: Secondary | ICD-10-CM | POA: Diagnosis not present

## 2020-05-10 DIAGNOSIS — N939 Abnormal uterine and vaginal bleeding, unspecified: Secondary | ICD-10-CM

## 2020-05-10 MED ORDER — ESTRADIOL 0.05 MG/24HR TD PTWK: 0.0500 mg | MEDICATED_PATCH | TRANSDERMAL | 4 refills | Status: DC

## 2020-05-10 MED ORDER — NORELGESTROMIN-ETH ESTRADIOL 150-35 MCG/24HR TD PTWK: 1.0000 | MEDICATED_PATCH | TRANSDERMAL | 5 refills | Status: AC

## 2020-05-10 NOTE — Progress Notes (Signed)
   Margaret Miranda 1976/04/07 [id number]   History:  44 y.o. G1P1001 presents as new patient to establish care. Paraguard IUD inserted 04/2017. Cycles every 22 days, somewhat heavy. 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 Normal mammogram history. Mother with history of ovarian cancer. History of migraine without aura and hypothyroidism. Says headaches always occur the week before her cycle and can be debilitating.   Gynecologic History No LMP recorded. (Menstrual status: IUD).   Contraception: IUD Last Pap: 2020 per patient. Results were: normal Last mammogram: 04/12/2020. Results were: normal  Past medical history, past surgical history, family history and social history were all reviewed and documented in the EPIC chart. 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   ROS:  A ROS was performed and pertinent positives and negatives are included.  Exam:  Vitals:   05/10/20 1132  BP: 118/80  Weight: 173 lb (78.5 kg)   Body mass index is 29.7 kg/m.  General appearance:  Normal Thyroid:  Symmetrical, normal in size, without palpable masses or nodularity. Respiratory  Auscultation:  Clear without wheezing or rhonchi Cardiovascular  Auscultation:  Regular rate, without rubs, murmurs or gallops  Edema/varicosities:  Not grossly evident Abdominal  Soft,nontender, without masses, guarding or rebound.  Liver/spleen:  No organomegaly noted  Hernia:  None appreciated  Skin  Inspection:  Grossly normal   Breasts: Examined lying and sitting.   Right: Without masses, retractions, discharge or axillary adenopathy.   Left: Without masses, retractions, discharge or axillary adenopathy. Gentitourinary   Inguinal/mons:  Normal without inguinal adenopathy  External genitalia:  Normal  BUS/Urethra/Skene's glands:  Normal  Vagina:  Normal  Cervix:  Normal, IUD string visible  Uterus:  Anteverted, normal in size, shape and contour.  Midline and mobile  Adnexa/parametria:     Rt: Without masses  or tenderness.   Lt: Without masses or tenderness.  Anus and perineum: Normal  Digital rectal exam: Normal sphincter tone without palpated masses or tenderness  Assessment/Plan:  44 y.o. G1P1001 for annual exam.   Well female exam with routine gynecological exam - Education provided on SBEs, importance of preventative screenings, current guidelines, high calcium diet, regular exercise, and multivitamin daily. Labs elsewhere.   Menstrual migraine without status migrainosus, not intractable - Headaches occur monthly 1 week before menses. We discussed the option to try combination hormone manipulation for management of these. She does not want a pill. Ortho Evra patch continuously recommended and with hopes to also help manage heavy, frequent cycles. If she is agreeable I will send to her pharmacy.   Encounter for routine checking of intrauterine contraceptive device (IUD) - Paraguard inserted 04/2017. Heavy monthly cycles about every 22 days.   Screening for cervical cancer - 2011 CIN-1, subsequent paps normal. Most recent in 2020 per patient. We will return to 3-year interval.   Screening for breast cancer - Normal mammogram history. History of benign breast biopsies. Continue annual screenings. Normal breast exam today.   Follow up in 1 year for annual.     [name] [facility], 11:41 AM 05/10/2020

## 2020-05-10 NOTE — Telephone Encounter (Signed)
Staff message received from TW. "Please let patient know that since her IUD is paraguard and not Mirena (like she thought when we decided this) we cannot do an estradiol patch but instead have to do a combination patch with estrogen + progesterone. She would also need to wear the patch continuously instead of just the week prior to her menses for the migraine management (skip placebo week). If she is agreeable I will send in weekly Ortho Evra patches. Let her know this would also help with her heavy cycles. I have discontinue Climara patches but not sure if we need to let the pharmacy know? "  Spoke with patient and read her Tiffany's note. She is in agreement. I called [facility] and cancelled the Rx for Climara.  TW to send Rx for Ortho Evra. Routed to TW.

## 2020-05-10 NOTE — Patient Instructions (Signed)
Health Maintenance, Female Adopting a healthy lifestyle and getting preventive care are important in promoting health and wellness. Ask your health care provider about:  The right schedule for you to have regular tests and exams.  Things you can do on your own to prevent diseases and keep yourself healthy. What should I know about diet, weight, and exercise? Eat a healthy diet   Eat a diet that includes plenty of vegetables, fruits, low-fat dairy products, and lean protein.  Do not eat a lot of foods that are high in solid fats, added sugars, or sodium. Maintain a healthy weight Body mass index (BMI) is used to identify weight problems. It estimates body fat based on height and weight. Your health care provider can help determine your BMI and help you achieve or maintain a healthy weight. Get regular exercise Get regular exercise. This is one of the most important things you can do for your health. Most adults should:  Exercise for at least 150 minutes each week. The exercise should increase your heart rate and make you sweat (moderate-intensity exercise).  Do strengthening exercises at least twice a week. This is in addition to the moderate-intensity exercise.  Spend less time sitting. Even light physical activity can be beneficial. Watch cholesterol and blood lipids Have your blood tested for lipids and cholesterol at 42 years of age, then have this test every 5 years. Have your cholesterol levels checked more often if:  Your lipid or cholesterol levels are high.  You are older than 42 years of age.  You are at high risk for heart disease. What should I know about cancer screening? Depending on your health history and family history, you may need to have cancer screening at various ages. This may include screening for:  Breast cancer.  Cervical cancer.  Colorectal cancer.  Skin cancer.  Lung cancer. What should I know about heart disease, diabetes, and high blood  pressure? Blood pressure and heart disease  High blood pressure causes heart disease and increases the risk of stroke. This is more likely to develop in people who have high blood pressure readings, are of African descent, or are overweight.  Have your blood pressure checked: ? Every 3-5 years if you are 18-39 years of age. ? Every year if you are 40 years old or older. Diabetes Have regular diabetes screenings. This checks your fasting blood sugar level. Have the screening done:  Once every three years after age 40 if you are at a normal weight and have a low risk for diabetes.  More often and at a younger age if you are overweight or have a high risk for diabetes. What should I know about preventing infection? Hepatitis B If you have a higher risk for hepatitis B, you should be screened for this virus. Talk with your health care provider to find out if you are at risk for hepatitis B infection. Hepatitis C Testing is recommended for:  Everyone born from 1945 through 1965.  Anyone with known risk factors for hepatitis C. Sexually transmitted infections (STIs)  Get screened for STIs, including gonorrhea and chlamydia, if: ? You are sexually active and are younger than 42 years of age. ? You are older than 42 years of age and your health care provider tells you that you are at risk for this type of infection. ? Your sexual activity has changed since you were last screened, and you are at increased risk for chlamydia or gonorrhea. Ask your health care provider if   you are at risk.  Ask your health care provider about whether you are at high risk for HIV. Your health care provider may recommend a prescription medicine to help prevent HIV infection. If you choose to take medicine to prevent HIV, you should first get tested for HIV. You should then be tested every 3 months for as long as you are taking the medicine. Pregnancy  If you are about to stop having your period (premenopausal) and  you may become pregnant, seek counseling before you get pregnant.  Take 400 to 800 micrograms (mcg) of folic acid every day if you become pregnant.  Ask for birth control (contraception) if you want to prevent pregnancy. Osteoporosis and menopause Osteoporosis is a disease in which the bones lose minerals and strength with aging. This can result in bone fractures. If you are 65 years old or older, or if you are at risk for osteoporosis and fractures, ask your health care provider if you should:  Be screened for bone loss.  Take a calcium or vitamin D supplement to lower your risk of fractures.  Be given hormone replacement therapy (HRT) to treat symptoms of menopause. Follow these instructions at home: Lifestyle  Do not use any products that contain nicotine or tobacco, such as cigarettes, e-cigarettes, and chewing tobacco. If you need help quitting, ask your health care provider.  Do not use street drugs.  Do not share needles.  Ask your health care provider for help if you need support or information about quitting drugs. Alcohol use  Do not drink alcohol if: ? Your health care provider tells you not to drink. ? You are pregnant, may be pregnant, or are planning to become pregnant.  If you drink alcohol: ? Limit how much you use to 0-1 drink a day. ? Limit intake if you are breastfeeding.  Be aware of how much alcohol is in your drink. In the [place] one drink equals one 12 oz bottle of beer (355 mL), one 5 oz glass of wine (148 mL), or one 1 oz glass of hard liquor (44 mL). General instructions  Schedule regular health, dental, and eye exams.  Stay current with your vaccines.  Tell your health care provider if: ? You often feel depressed. ? You have ever been abused or do not feel safe at home. Summary  Adopting a healthy lifestyle and getting preventive care are important in promoting health and wellness.  Follow your health care provider's instructions about healthy  diet, exercising, and getting tested or screened for diseases.  Follow your health care provider's instructions on monitoring your cholesterol and blood pressure. This information is not intended to replace advice given to you by your health care provider. Make sure you discuss any questions you have with your health care provider. Document Revised: 05/05/2018 Document Reviewed: 05/05/2018 [employer] Patient Education  2020 [employer]  

## 2020-05-10 NOTE — Telephone Encounter (Signed)
Thank you. Patches were sent to her pharmacy.

## 2020-10-04 ENCOUNTER — Other Ambulatory Visit: Payer: Self-pay | Admitting: Nurse Practitioner

## 2020-10-04 DIAGNOSIS — G43829 Menstrual migraine, not intractable, without status migrainosus: Secondary | ICD-10-CM

## 2020-11-01 NOTE — Telephone Encounter (Signed)
Opened in error

## 2021-03-14 ENCOUNTER — Other Ambulatory Visit: Payer: Self-pay | Admitting: Family Medicine

## 2021-03-14 DIAGNOSIS — Z1231 Encounter for screening mammogram for malignant neoplasm of breast: Secondary | ICD-10-CM

## 2021-04-15 ENCOUNTER — Other Ambulatory Visit: Payer: Self-pay

## 2021-04-15 ENCOUNTER — Ambulatory Visit: Payer: BLUE CROSS/BLUE SHIELD

## 2021-05-13 ENCOUNTER — Ambulatory Visit: Payer: Self-pay | Admitting: Nurse Practitioner

## 2022-08-26 IMAGING — MG DIGITAL SCREENING BILAT W/ CAD
4 series · 4 of 4 positions shown · non-contrast
Comparison: Previous exam(s).

CLINICAL DATA: Screening. Biopsy-proven RIGHT breast PASH.

EXAM:
DIGITAL SCREENING BILATERAL MAMMOGRAM WITH CAD

[R MLO]
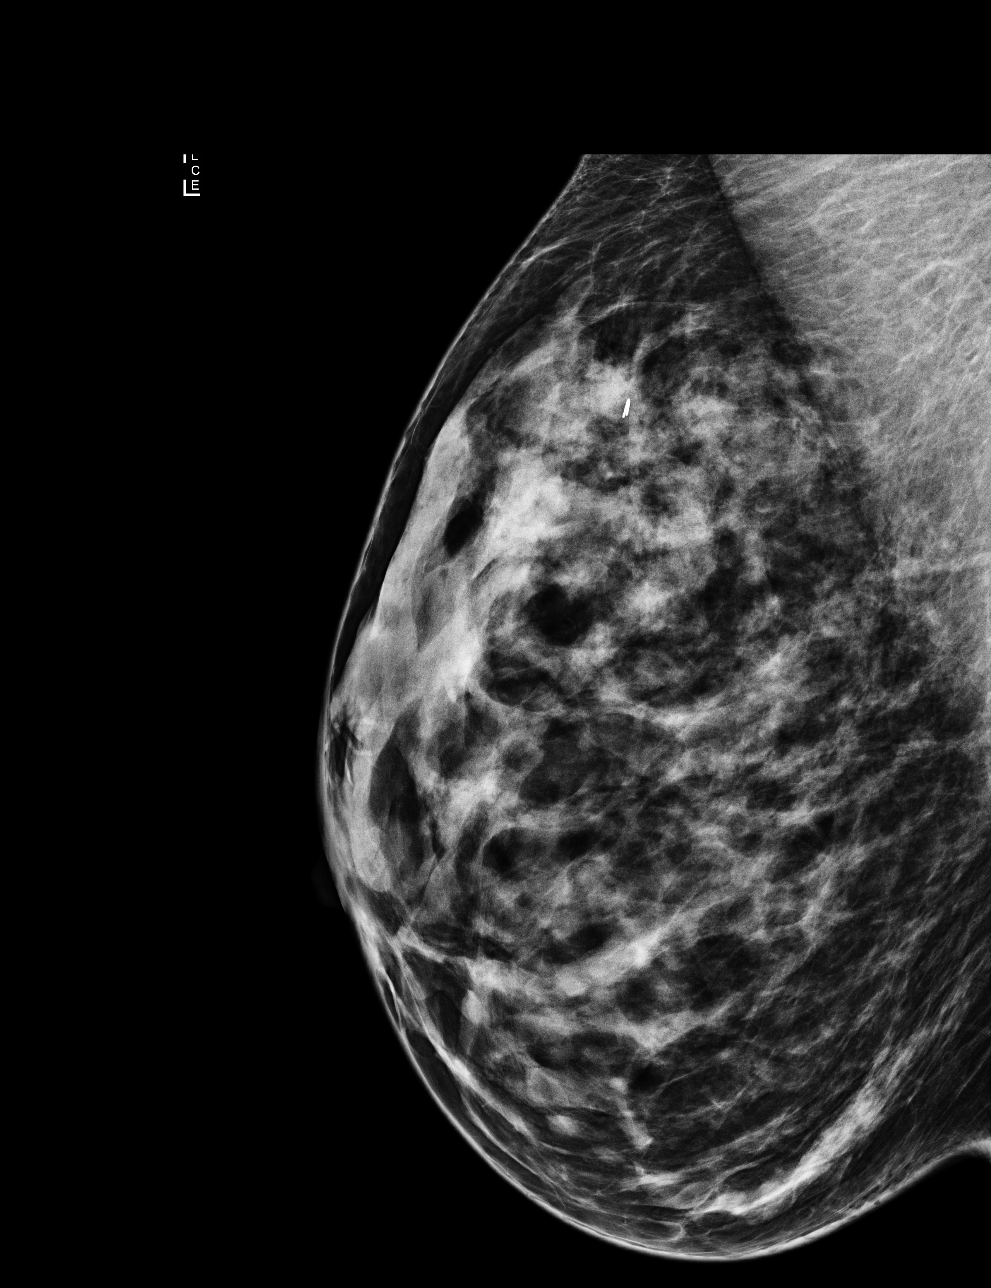

[R CC]
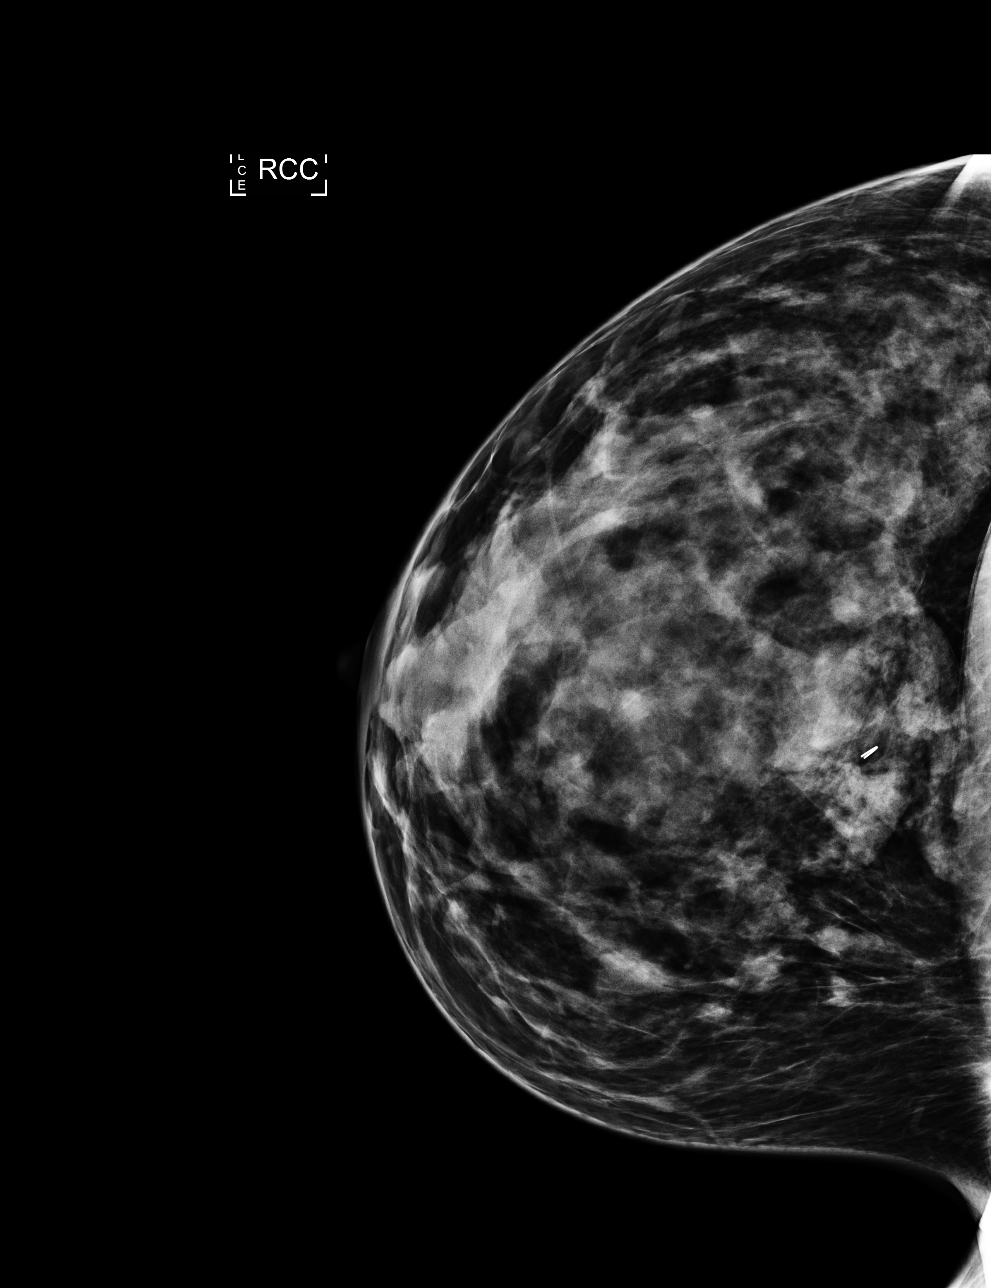

[L MLO]
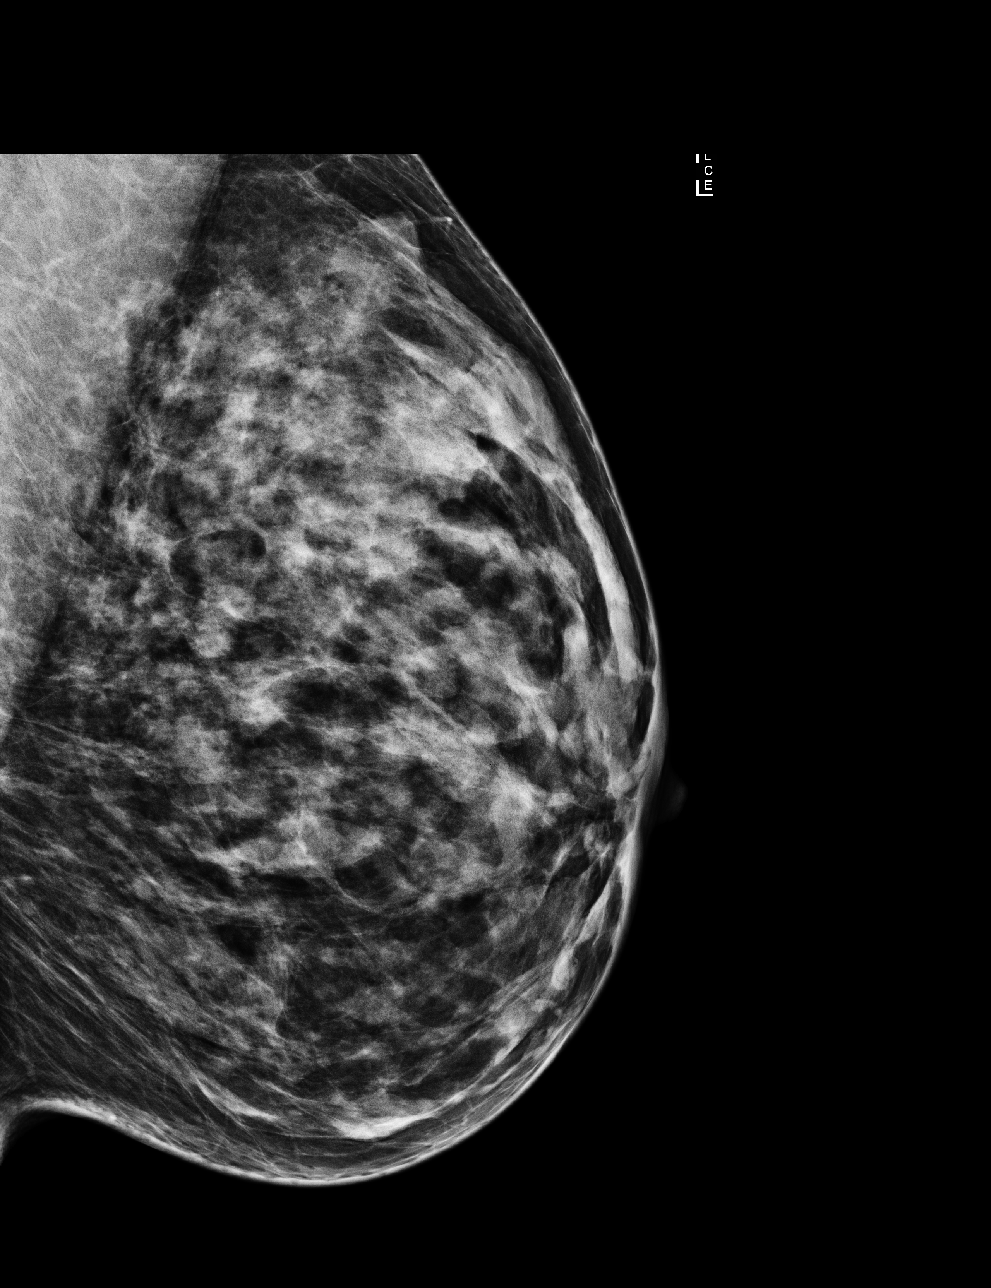

[L CC]
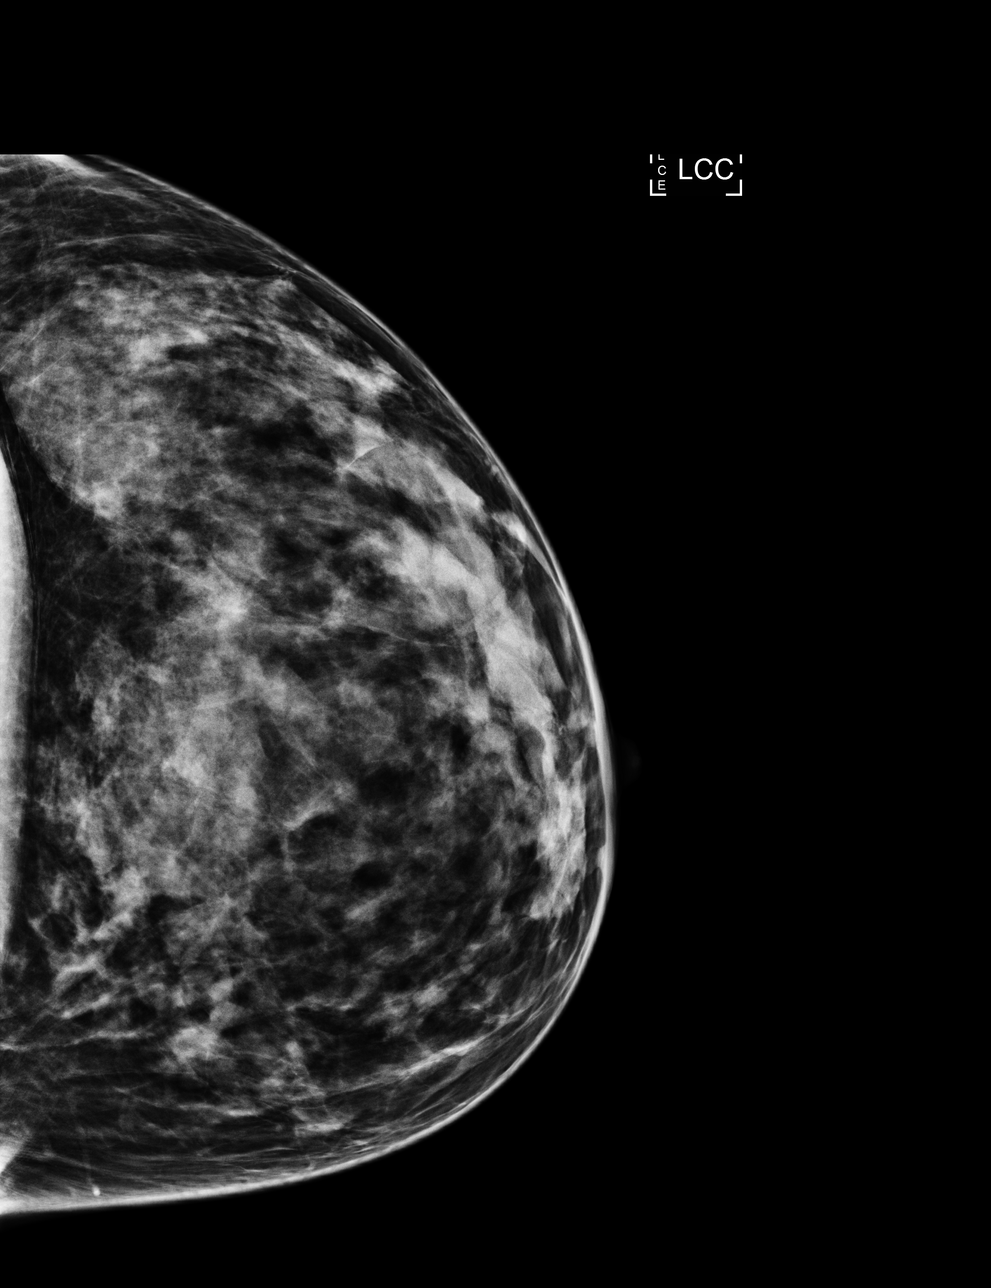

[4 of 4 positions shown; findings below may reference images not displayed]

ACR Breast Density Category c: The breast tissue is heterogeneously
dense, which may obscure small masses.
FINDINGS: There are no findings suspicious for malignancy. Images were
processed with CAD.
IMPRESSION: No mammographic evidence of malignancy. A result letter of this
screening mammogram will be mailed directly to the patient.

RECOMMENDATION:
Screening mammogram in one year. (Code:[id number])

BI-RADS CATEGORY  1: Negative.
